# Patient Record
Sex: Female | Born: 1950 | Race: White | Marital: Married | Smoking: Never smoker
Health system: Southern US, Community
[De-identification: ages and names within clinical notes are randomized; demographics above are authoritative.]

## PROBLEM LIST (undated history)

## (undated) HISTORY — PX: KNEE SURGERY: SHX244

## (undated) HISTORY — PX: CHOLECYSTECTOMY: SHX55

## (undated) HISTORY — PX: HERNIA REPAIR: SHX51

---

## 2003-08-28 ENCOUNTER — Ambulatory Visit
Admit: 2003-08-28 | Disposition: A | Discharge status: Home / Self Care | Payer: Self-pay | Source: Ambulatory Visit | Admitting: Sports Medicine"

## 2003-09-13 ENCOUNTER — Ambulatory Visit
Admission: RE | Admit: 2003-09-13 | Disposition: A | Discharge status: Home / Self Care | Payer: Self-pay | Source: Ambulatory Visit | Admitting: Sports Medicine"

## 2004-09-26 ENCOUNTER — Ambulatory Visit
Admit: 2004-09-26 | Disposition: A | Discharge status: Home / Self Care | Payer: Self-pay | Source: Ambulatory Visit | Admitting: Obstetrics & Gynecology

## 2007-08-13 ENCOUNTER — Ambulatory Visit
Admit: 2007-08-13 | Disposition: A | Discharge status: Home / Self Care | Payer: Self-pay | Source: Ambulatory Visit | Admitting: Obstetrics & Gynecology

## 2011-04-01 ENCOUNTER — Ambulatory Visit
Admission: RE | Admit: 2011-04-01 | Discharge: 2011-04-01 | Payer: Self-pay | Source: Ambulatory Visit | Attending: Pediatrics | Admitting: Pediatrics

## 2011-04-01 ENCOUNTER — Ambulatory Visit: Payer: Self-pay

## 2011-04-02 LAB — LAB USE ONLY - HISTORICAL SURGICAL PATHOLOGY

## 2011-08-05 ENCOUNTER — Ambulatory Visit
Admission: RE | Admit: 2011-08-05 | Discharge: 2011-08-05 | Payer: Self-pay | Source: Ambulatory Visit | Attending: Pediatrics | Admitting: Pediatrics

## 2011-08-06 LAB — LAB USE ONLY - HISTORICAL SURGICAL PATHOLOGY

## 2011-10-15 NOTE — Op Note (Signed)
Chelsea Jensen, BOEHRINGER      MRN:          [id number]      Account:      [id number]      Document ID:  [id number]      Procedure Date: 04/01/2011            Admit Date: 04/01/2011            Patient Location: DISCHARGED 04/01/2011      Patient Type: A            SURGEON: Odette Horns MD            ASSISTANT:  Donnarose Klemovitch PA-C            PREOPERATIVE DIAGNOSES:  Chronic cholecystitis, cholelithiasis.            POSTOPERATIVE DIAGNOSES:  Chronic cholecystitis, cholelithiasis.            TITLE OF PROCEDURE:  Laparoscopic cholecystectomy.            ANESTHESIOLOGIST: Dr. _____.            CRNA: Kizzie Fantasia.            ANESTHESIA: General endotracheal, 0.5% Marcaine with epinephrine.            DESCRIPTION OF PROCEDURE:      The patient was placed supine on the operating room table.  After induction      of general endotracheal anesthesia and placement of an orogastric tube, the      patient's abdomen was prepped and draped in the usual sterile fashion.      Prior to each incision, the skin and subcutaneous tissues were infiltrated      with 0.5% Marcaine with epinephrine.            A 10-mm midline subumbilical incision was made and carried down through the      midline fascia with cautery.  Digital palpation of the underlying abdominal      wall was unremarkable.  A bladeless 12-mm trocar was placed into the      peritoneal cavity which was insufflated with CO2 up to a pressure of 15 mmHg.      The patient was placed in reverse Trendelenburg position and tilted to the      left.  Under direct vision three 5-mm trocars were placed in the right      subcostal space.  The gallbladder fundus was grasped and retracted laterally      and cephalad.  In reflecting the infundibulum laterally and caudad I noted that      the greater omental fat was adherent to the anterior surface of the      gallbladder.  The greater omental adhesions were dissected free bluntly.  With      cautery and blunt dissection in the  triangle of Calot,                                   Page 1 of 2      AMIL, MOSEMAN      MRN:          [id number]      Account:      [id number]      Document ID:  [id number]      Procedure Date: 04/01/2011            both  the cystic artery and the cystic duct were dissected free of fat.      Each structure was doubly clipped proximally, singly clipped distally, and      transected.  The gallbladder was completely dissected free off of the      gallbladder fossa with cautery.  The gallbladder was placed in an Endocatch      pouch and removed from the peritoneal cavity through the umbilical trocar      site.  Due to the large size of the stone, the umbilical trocar site fascia      was extended inferiorly with cautery.            In reevaluation of the perihepatic space I did not note any hemorrhage. The      clips were intact.  Under direct vision trocars were removed from the abdominal      wall verifying no hemorrhage.  Pneumoperitoneum was released. Fascia at the      umbilicus was reapproximated with 2 figure-of-eight 0 Vicryl sutures. Skin at      all 4 trocar sites was reapproximated with 4-0 Monocryl running subcuticular      suture.  Band-Aids were placed.  A total of 20 mL of 0.5% Marcaine with      epinephrine was used for the procedure.  The orogastric tube was removed. She      was extubated.  She was taken to the recovery room in stable condition.                        Electronic Signing Provider            D:  04/01/2011 16:24 PM by Dr. Jimmie Molly. Loleta Chance, MD ([id number])      T:  04/02/2011 10:26 AM by [id number]                  cc:                                   Page 2 of 2      Authenticated and Edited by Roque Lias, MD ([id number]) On 04/14/11 9:22:50 AM

## 2011-11-01 NOTE — Op Note (Signed)
Chelsea Jensen, Chelsea Jensen      MRN:          [id number]      Account:      [id number]      Document ID:  [id number]      Procedure Date: 08/05/2011            Admit Date: 08/05/2011            Patient Location: [facility]03      Patient Type: A            SURGEON: Odette Horns MD            ASSISTANT:  Lindalou Hose Tem[place]uk PA-C            PREOPERATIVE DIAGNOSES: Incisional hernia at the umbilicus, status post      laparoscopic cholecystectomy April 2012.            POSTOPERATIVE DIAGNOSES: Incisional hernia at the umbilicus, status post      laparoscopic cholecystectomy April 2012.            TITLE OF PROCEDURE: Incisional intraperitoneal mesh hernioplasty.            ANESTHESIOLOGIST: Dr. Lucile Shutters.            CRNA: Lyndle Herrlich, CRNA.            ANESTHESIA: General endotracheal, 0.5% Marcaine with epinephrine.            DESCRIPTION OF PROCEDURE:      The patient was placed supine on the operating room table.  After induction      of general endotracheal anesthesia, the patient's abdomen was prepped and      draped in the usual sterile fashion.  Prior to incision, the skin and      subcutaneous tissues in the subumbilical space were infiltrated with 0.5%      Marcaine with epinephrine.            A curvilinear incision was made between 3 and 9 o'clock in the subumbilical      space, and carrying this incision deeper into the subcutaneous tissues, I      noted a large hernia sac.  The hernia sac was dissected free from the      surrounding fatty tissues with a combination of cautery and sharp      dissection.  After defining the fascial edge circumferentially, the hernia      sac was completely transected off of the fascial edge and handed off the      table for pathological evaluation.  The base of the umbilicus was sharply      dissected off of the underlying fascia.  To digital palpation, in the      intraabdominal wall, I cleared all of the fat.  I did not note any adherent                                    Page 1 of 2      Chelsea Jensen, Chelsea Jensen      MRN:          [id number]      Account:      [id number]      Document ID:  [id number]      Procedure Date: 08/05/2011            small bowel loops or greater omentum.  I used a silk suture to measure the      diameter of the fascial defect which was almost 4 cm. The fascial defect was      round in shape, and so I elected to use a 10-cm round [employer] mesh for      hernia repair.  The mesh was placed into the peritoneal cavity. At the 12, 3,      6, and 9 o'clock positions, 1- 0 PDS suture was used transfascially to tack the      mesh to the overlying abdominal wall.  The polypropylene side of the mesh was      pulled up taut to the fascia, and the 4 PDS sutures were knotted at 12, 3, 6      and 9 o'clock.  I verified that the mesh laid flat.  After ensuring that there      were no bowel loops between the mesh and the abdominal wall, I used a SorbaFix      tacker, placing tacks approximately every 1 cm at the edge of the polypropylene      layer of the mesh.  To palpation, I noted that the mesh was tacked up to the      abdominal wall with no gaps.  At the 3 and 9 o'clock positions, I used a 3-0      Monocryl suture to tack the mesh to the overlying abdominal wall.  I noted that      the fascia could be easily reapproximated in a transverse manner over the mesh      and so this was accomplished with interrupted 3- 0 Monocryl sutures.  With each      suture, the polypropylene side of the mesh was also tacked to the fascia in      order to minimize empty space.  I should note that, before closing the fascia,      the space was copiously irrigated with normal saline and aspirated.  I verified      hemostasis.  The umbilicus was tacked to the abdominal fascia with a figure-of-      eight 3-0 Monocryl suture.  A more superficial layer of fat was reapproximated      transversely with interrupted 3-0 Monocryl suture.  Again, this space was      irrigated and aspirated.  I  verified hemostasis.  The skin edges were      reapproximated with 4-0 Monocryl running subcuticular suture.  A total of 20 mL      of 0.5% Marcaine with epinephrine was used for the procedure.  Bacitracin      ointment was placed as was a dressing.  She was placed in a 3 panel      elastasized abdominal binder, extubated, and taken to the recovery room in      stable condition.                        Electronic Signing Provider            D:  08/05/2011 09:55 AM by Dr. Jimmie Molly. Loleta Chance, MD ([id number])      T:  08/05/2011 10:37 AM by [id number]                  cc:     Lenetta Quaker MD  Page 2 of 2      Authenticated and Edited by Roque Lias, MD ([id number]) On 08/11/11 11:11:44 AM

## 2012-12-07 NOTE — Op Note (Unsigned)
SURGEON:             Cassell Smiles, MD      ADMITTED:            09/13/2003      PROCEDURE DATE: 09/13/2003      ROOM NUMBER:         [id number]            PREOPERATIVE DIAGNOSIS:  Left knee medial meniscus tear with loose body.            POSTOPERATIVE DIAGNOSIS:  Left knee medial meniscus tear, osteochondral      defect medial femoral condyle and trochlea with loose body.            PROCEDURE:  Arthroscopy, left knee, with medial meniscus repair, excision      loose body, chondroplasty medial femoral condyle and trochlea with [employer]      biopsy.            ANESTHESIA:  Local with intravenous sedation.            OPERATIVE INDICATIONS AND FINDINGS:  Chelsea Jensen is a 61 year old      otherwise healthy woman who has had locking of her left knee, unrelieved      with conservative treatment.  She had an MRI which showed a tear in the      medial meniscus and a loose body.  She was given risks, benefits, and      alternatives, and opted for surgical intervention.  Her operative findings      were a 1 x 1-cm loose body, tear in the posterior horn medial meniscus zone      A1, osteochondral defect measuring 20 x 20 mm on the medial femoral condyle      and trochlea.            OPERATIVE PROCEDURE IN DETAIL:  Once informed consent was obtained, the      patient was brought to the operating room.  A gram of Ancef was given IV      preoperatively for antibiotic prophylaxis.  All anatomic landmarks were      palpated.  The skin was sterilely prepped and draped in the usual fashion.      First, portals were anesthetized using 20 mL of 1% Lidocaine, and then 20      mL of 0.5% Marcaine with epinephrine.  Once the block had set up, 3 portals      were made, 1 superolateral, 1 inferolateral, and 1 inferomedial around the      patella for the introduction of the outflow, arthroscope, and instruments      respectively.  All compartments of the knee were visualized.  In the      suprapatellar pouch, there were no loose  bodies.  Medial and lateral facets      of the patella showed mild chondromalacia changes.  In the femoral      trochlea, there was a 20 x 20-mm osteochondral defect.  This was debrided      to a stable rim using a shaver.  This operative time was 15 minutes.  In      the medial compartment, there was a tear in the posterior horn of the      medial meniscus.  This was rasped and then repaired using a FasT-Fix suture      anchor.  There was also an osteochondral defect measuring 20 x 20 mm here.  This was debrided using a shaver.  This operative time was 15 minutes.  In      the intercondylar notch, there was a loose body present.  This was excised      using a grasper.  In the lateral compartment, lateral meniscus and lateral      articular surfaces were intact.  The instruments were then removed from the      knee.  The portals were closed using 4-0 Monocryl suture, and the knee was      instilled with 20 mL of 0.5% Marcaine with epinephrine at the end of the      case.  All sponge and needle counts were correct at the end of the case.      Estimated blood loss minimal.  IV fluids were 1000 mL crystalloid.  The      patient tolerated the procedure well and was brought to recovery room in      excellent condition.                              ___________________________________     Date Signed: _______________      Cassell Smiles, MD                  D 09/13/2003  1:11 P; T 09/14/2003 11:35 A; [id number] - - , [id number], #[id number]      CC:  Cassell Smiles, MD

## 2013-01-26 ENCOUNTER — Ambulatory Visit: Payer: BLUE CROSS/BLUE SHIELD | Admitting: Family

## 2013-01-26 ENCOUNTER — Encounter: Payer: Self-pay | Admitting: Family

## 2013-01-26 VITALS — BP 118/75 | HR 99 | Temp 98.0°F | Ht 65.5 in | Wt 184.8 lb

## 2013-01-26 MED ORDER — LOSARTAN POTASSIUM 50 MG PO TABS
50.0000 mg | ORAL_TABLET | Freq: Every day | ORAL | Status: DC
Start: 2013-01-26 — End: 2014-02-09

## 2013-01-26 NOTE — Progress Notes (Signed)
Subjective:       Patient ID: Chelsea Jensen is a 62 y.o. female.    HPIDoing well with current medications for BP and depression.  Sees psychiatry for follow up.      The following portions of the patient's history were reviewed and updated as appropriate: allergies, current medications, past family history, past medical history, past social history, past surgical history and problem list.    Review of Systems   Constitutional: Negative for activity change, appetite change and fatigue.        HAs had some weight loss.   Respiratory: Negative.    Cardiovascular: Negative.    Psychiatric/Behavioral: Negative.            Objective:    Physical Exam   Constitutional: She is oriented to person, place, and time. She appears well-developed and well-nourished.   Cardiovascular: Normal rate.    Pulmonary/Chest: Effort normal.   Neurological: She is alert and oriented to person, place, and time.   Psychiatric: She has a normal mood and affect. Her behavior is normal. Thought content normal.           Assessment:       Depression. Sees psychiatry  HTN. Stable.      Plan:       Refill meds  Discussed diet and exercise.  Follow up.

## 2014-02-09 ENCOUNTER — Encounter: Payer: Self-pay | Admitting: Internal Medicine

## 2014-02-09 ENCOUNTER — Ambulatory Visit: Payer: BLUE CROSS/BLUE SHIELD | Admitting: Internal Medicine

## 2014-02-09 VITALS — BP 125/62 | HR 77 | Temp 97.8°F | Resp 18 | Ht 65.0 in | Wt 184.4 lb

## 2014-02-09 DIAGNOSIS — Z1211 Encounter for screening for malignant neoplasm of colon: Secondary | ICD-10-CM

## 2014-02-09 DIAGNOSIS — Z2911 Encounter for prophylactic immunotherapy for respiratory syncytial virus (RSV): Secondary | ICD-10-CM

## 2014-02-09 DIAGNOSIS — I10 Essential (primary) hypertension: Secondary | ICD-10-CM

## 2014-02-09 DIAGNOSIS — F411 Generalized anxiety disorder: Secondary | ICD-10-CM

## 2014-02-09 DIAGNOSIS — Z299 Encounter for prophylactic measures, unspecified: Secondary | ICD-10-CM

## 2014-02-09 DIAGNOSIS — Z23 Encounter for immunization: Secondary | ICD-10-CM

## 2014-02-09 DIAGNOSIS — Z029 Encounter for administrative examinations, unspecified: Secondary | ICD-10-CM

## 2014-02-09 DIAGNOSIS — Z1231 Encounter for screening mammogram for malignant neoplasm of breast: Secondary | ICD-10-CM

## 2014-02-09 DIAGNOSIS — Z1159 Encounter for screening for other viral diseases: Secondary | ICD-10-CM

## 2014-02-09 DIAGNOSIS — F419 Anxiety disorder, unspecified: Secondary | ICD-10-CM

## 2014-02-09 MED ORDER — LOSARTAN POTASSIUM 50 MG PO TABS
50.0000 mg | ORAL_TABLET | Freq: Every day | ORAL | Status: DC
Start: 2014-02-09 — End: 2014-12-25

## 2014-02-09 NOTE — Progress Notes (Signed)
Subjective:       Patient ID: Chelsea Jensen is a 63 y.o. female.    Hypertension  This is a chronic problem. The current episode started more than 1 year ago. The problem has been gradually improving since onset. The problem is controlled. Pertinent negatives include no anxiety, chest pain, headaches, malaise/fatigue, neck pain, orthopnea, palpitations, peripheral edema, PND or shortness of breath. Risk factors for coronary artery disease include obesity. Past treatments include ACE inhibitors. The current treatment provides moderate improvement. There is no history of angina, kidney disease, CAD/MI, CVA, heart failure, left ventricular hypertrophy or renovascular disease. There is no history of chronic renal disease or hyperparathyroidism.       The following portions of the patient's history were reviewed and updated as appropriate: allergies, current medications, past family history, past medical history, past social history, past surgical history and problem list.    Review of Systems   Constitutional: Negative for malaise/fatigue.   Respiratory: Negative for shortness of breath.    Cardiovascular: Negative for chest pain, palpitations, orthopnea and PND.   Musculoskeletal: Negative for neck pain.   Neurological: Negative for headaches.           Objective:    Physical Exam  General appearance - alert, well appearing, and in no distress  Mental status - alert, oriented to person, place, and time  Chest - clear to auscultation, no wheezes, rales or rhonchi, symmetric air entry  Heart - normal rate, regular rhythm, normal S1, S2, no murmurs,   Back exam - full range of motion, no tenderness, palpable spasm or pain on motion  Neurological - alert, oriented, normal speech, no focal findings or movement disorder noted  Musculoskeletal - no joint tenderness, deformity or swelling  Extremities - peripheral pulses normal, no pedal edema, no clubbing or cyanosis  Skin - normal coloration and turgor, no rashes, no  suspicious skin lesions noted    Patient Active Problem List   Diagnosis   . HTN (hypertension)   . Anxiety     Current Outpatient Prescriptions   Medication Sig Dispense Refill   . buPROPion SR (WELLBUTRIN SR) 150 MG 12 hr tablet Take 150 mg by mouth 2 (two) times daily.       . DULoxetine (CYMBALTA) 60 MG capsule Take 60 mg by mouth daily.       Marland Kitchen losartan (COZAAR) 50 MG tablet Take 1 tablet (50 mg total) by mouth daily.  90 tablet  3   . [DISCONTINUED] losartan (COZAAR) 50 MG tablet Take 1 tablet (50 mg total) by mouth daily.  90 tablet  3     No Known Allergies          Assessment:        htn      Plan:        advice low salt cut simple carb add veg/fish/whole grain bp log target 130/85    Shingle vaccination given    Preventive referal given blood work order

## 2014-02-10 ENCOUNTER — Telehealth: Payer: Self-pay

## 2014-02-10 LAB — COMPREHENSIVE METABOLIC PANEL
ALT: 17 IU/L (ref 0–32)
AST (SGOT): 17 IU/L (ref 0–40)
Albumin/Globulin Ratio: 1.9 (ref 1.1–2.5)
Albumin: 4.4 g/dL (ref 3.6–4.8)
Alkaline Phosphatase: 50 IU/L (ref 39–117)
BUN / Creatinine Ratio: 18 (ref 11–26)
BUN: 14 mg/dL (ref 8–27)
Bilirubin, Total: 0.4 mg/dL (ref 0.0–1.2)
CO2: 27 mmol/L (ref 18–29)
Calcium: 9.6 mg/dL (ref 8.7–10.3)
Chloride: 101 mmol/L (ref 97–108)
Creatinine: 0.78 mg/dL (ref 0.57–1.00)
EGFR: 82 mL/min/{1.73_m2} (ref >59)
EGFR: 95 mL/min/{1.73_m2} (ref >59)
Globulin, Total: 2.3 g/dL (ref 1.5–4.5)
Glucose: 86 mg/dL (ref 65–99)
Potassium: 4.4 mmol/L (ref 3.5–5.2)
Protein, Total: 6.7 g/dL (ref 6.0–8.5)
Sodium: 142 mmol/L (ref 134–144)

## 2014-02-10 LAB — CARDIOVASCULAR RISK ASSESSMENT: Cardiovascular Risk PDF Image: 0

## 2014-02-10 LAB — HEMOGLOBIN A1C: Hemoglobin A1C: 5.7 % — ABNORMAL HIGH (ref 4.8–5.6)

## 2014-02-10 LAB — LIPID PANEL
Cholesterol / HDL Ratio: 3.4 ratio units (ref 0.0–4.4)
Cholesterol: 227 mg/dL — ABNORMAL HIGH (ref 100–199)
HDL: 67 mg/dL (ref >39)
LDL Calculated: 141 mg/dL — ABNORMAL HIGH (ref 0–99)
Triglycerides: 97 mg/dL (ref 0–149)
VLDL Calculated: 19 mg/dL (ref 5–40)

## 2014-02-10 LAB — TSH: TSH: 3.39 u[IU]/mL (ref 0.450–4.500)

## 2014-02-10 LAB — HEPATITIS C ANTIBODY: HCV AB: <0.1 s/co ratio (ref 0.0–0.9)

## 2014-02-10 NOTE — Telephone Encounter (Signed)
Left message on pt.'s vm to call office.

## 2014-02-10 NOTE — Telephone Encounter (Signed)
Message copied by Margarette Canada on Fri Feb 10, 2014  9:32 AM  ------       Message from: [facility], ALI       Created: Fri Feb 10, 2014  7:37 AM         ldl elevated also mild a1c elevated  Cut simple carb likepast/bread   Add veg /fish/fruit  Cook in olive oil  Counsel 150 min/wk of  Exercise  Follow up 6 month

## 2014-12-25 ENCOUNTER — Telehealth (INDEPENDENT_AMBULATORY_CARE_PROVIDER_SITE_OTHER): Payer: Self-pay | Admitting: Internal Medicine

## 2014-12-25 DIAGNOSIS — I10 Essential (primary) hypertension: Secondary | ICD-10-CM

## 2014-12-25 MED ORDER — LOSARTAN POTASSIUM 50 MG PO TABS
50.0000 mg | ORAL_TABLET | Freq: Every day | ORAL | Status: AC
Start: 2014-12-25 — End: ?

## 2014-12-25 NOTE — Telephone Encounter (Signed)
Medication refill done

## 2021-02-27 IMAGING — MG MAMMO BREAST SCREENING TOMOSYNTHESIS BILATERAL
9 of 14 series · 9 of 30 positions shown · non-contrast
Comparison: none

This is a summary report. The complete report is available in the patient's medical record. If you cannot access the medical record, please contact the sending organization for a detailed fax or copy.
EXAM:
Mammogram breast screening tomosynthesis bilateral
REASON FOR EXAM: breast cancer screening
HISTORY: Patient is 70 y.o. Family medical history includes hypertension in mother. Hormone history includes other. Surgical history includes hernia repair and tonsillectomy. Medical history includes hypertension and asthma.
LMP: No LMP recorded (lmp unknown). Patient is postmenopausal.
BREAST COMPOSITION:
The breasts have scattered areas of fibroglandular density.

[L XCCL]
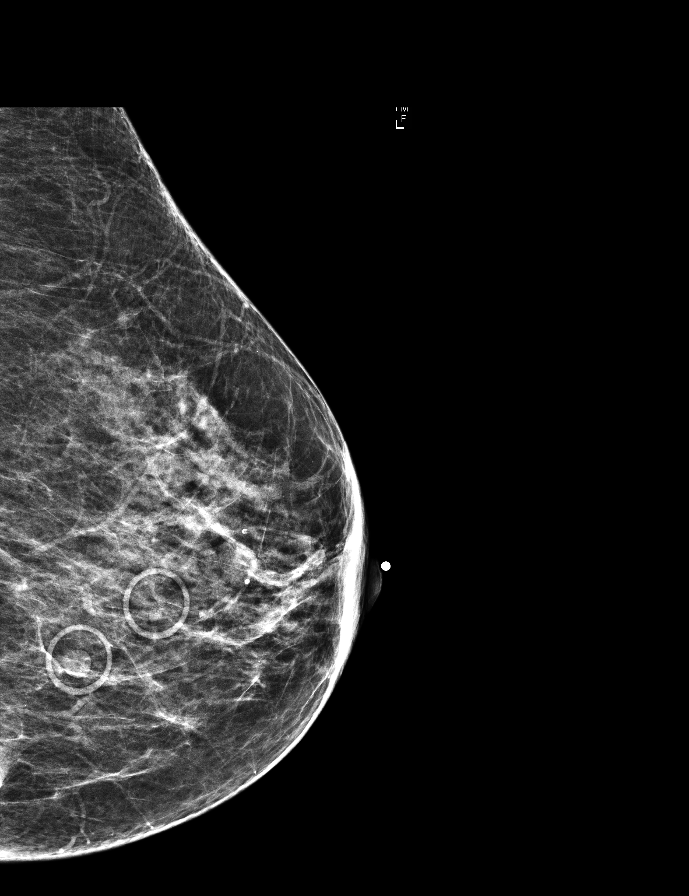

[R XCCL]
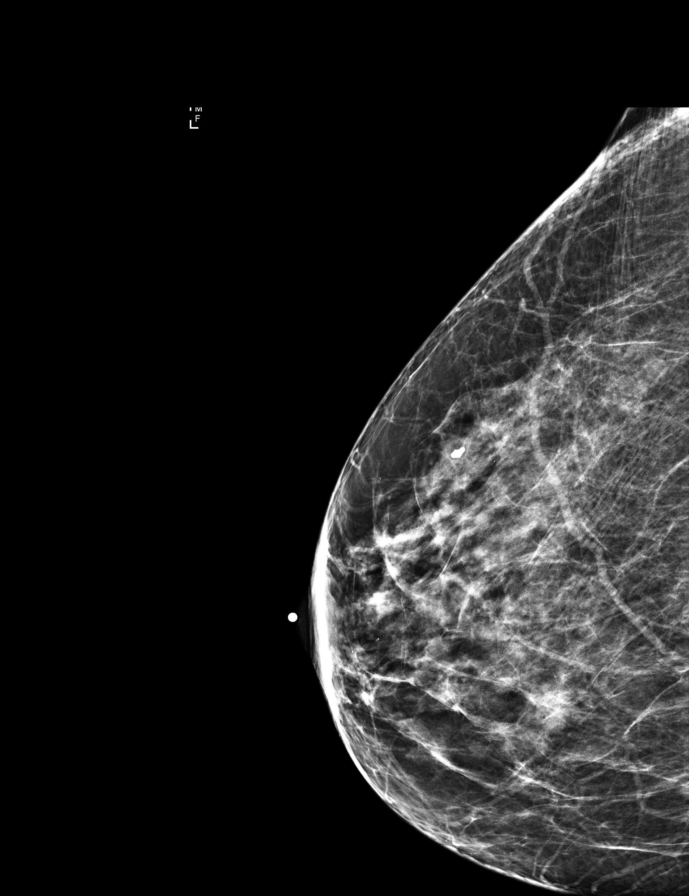

[L CC synth-2D]
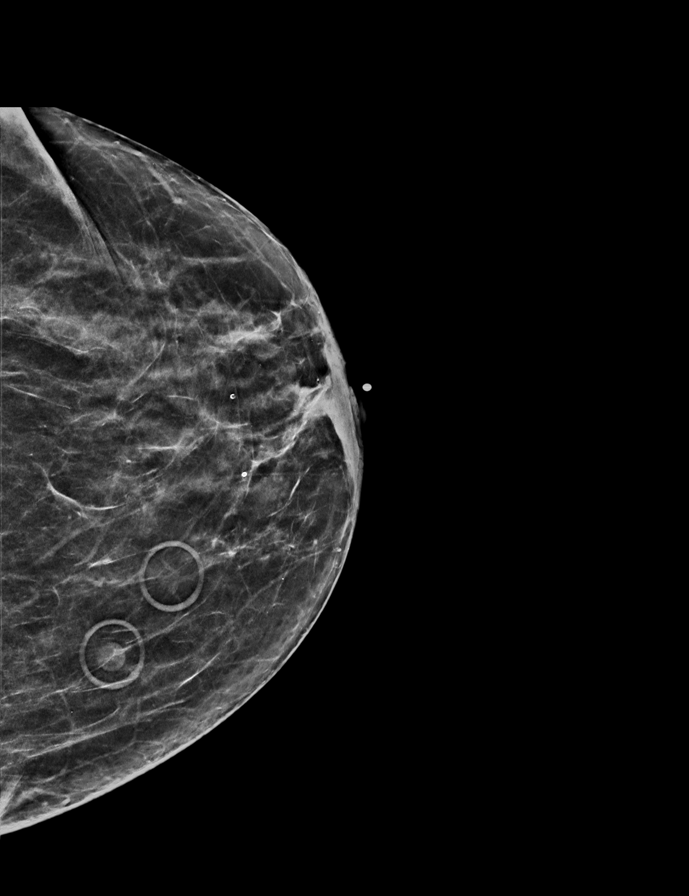

[R MLO]
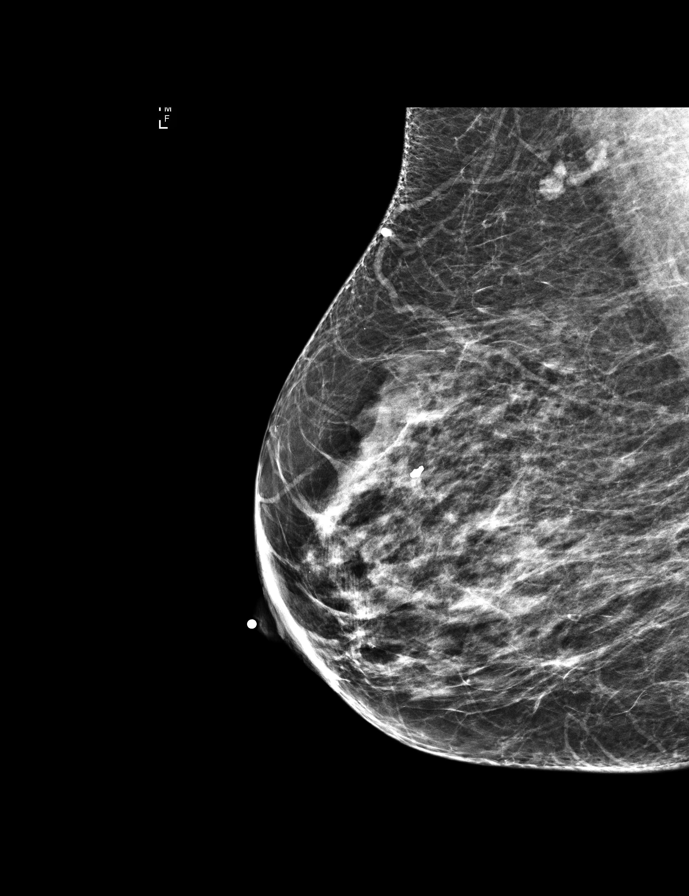

[R CC]
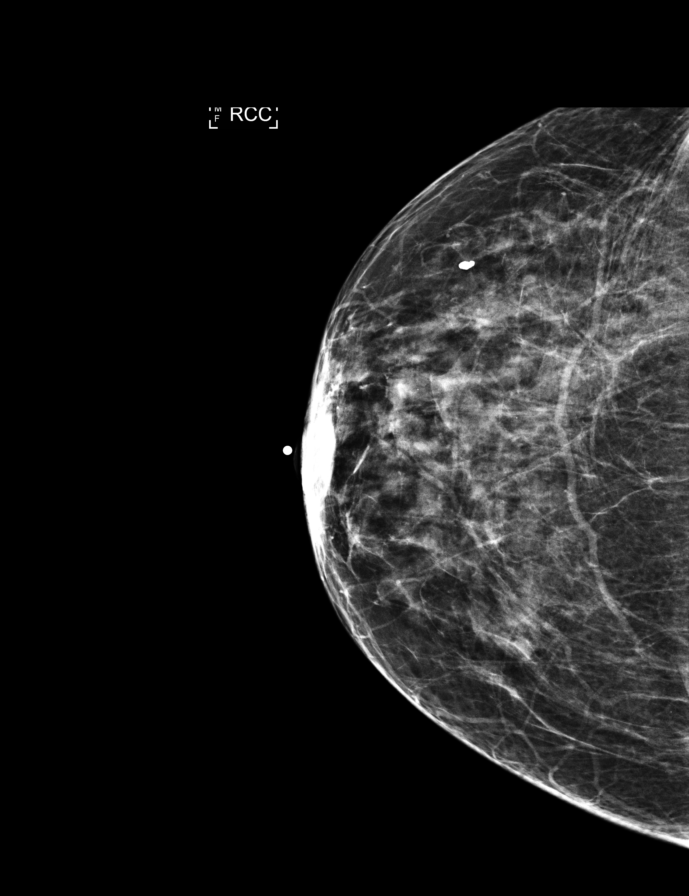

[L CC]
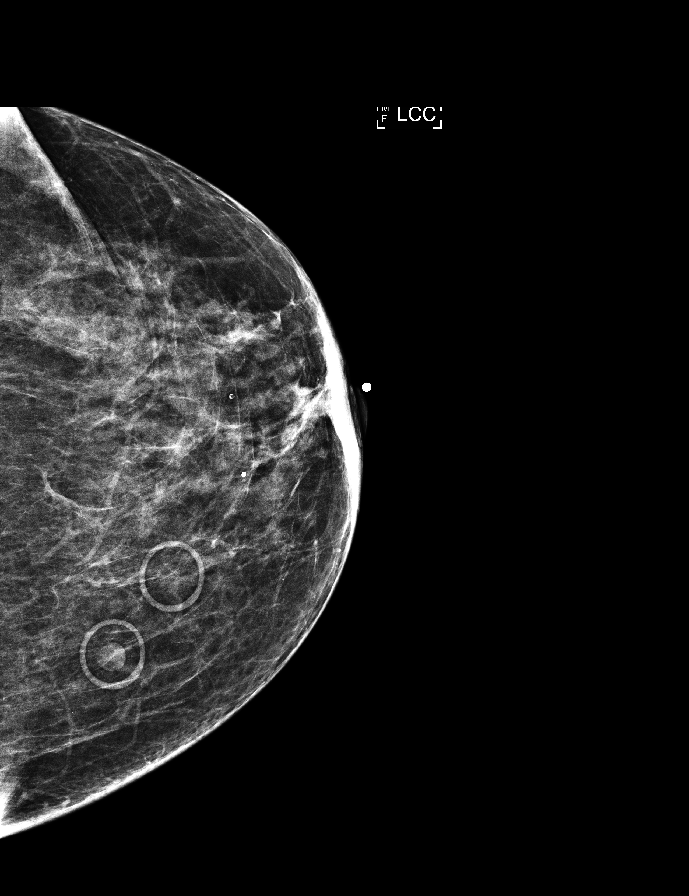

[R CC synth-2D]
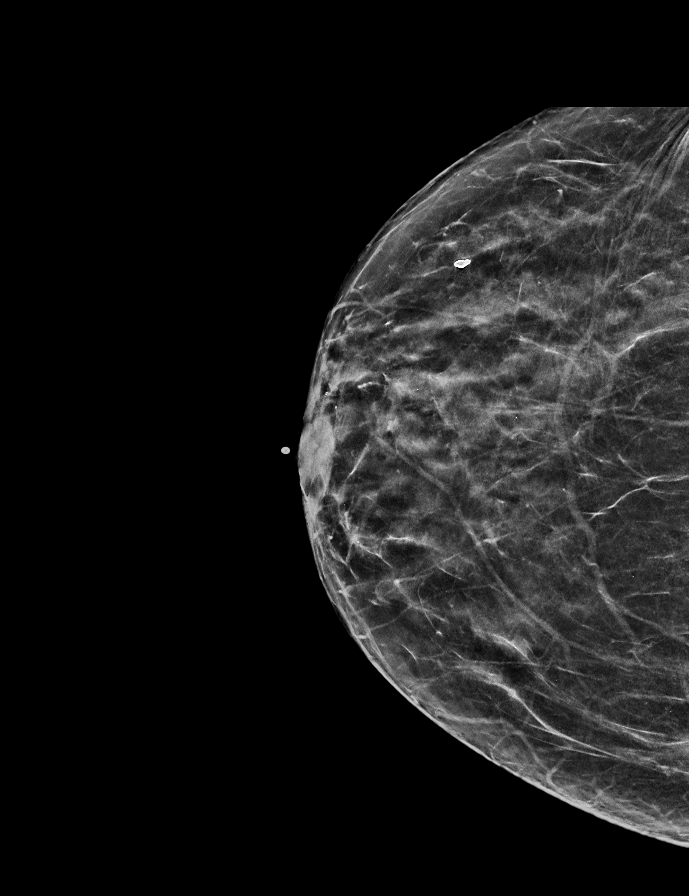

[L MLO]
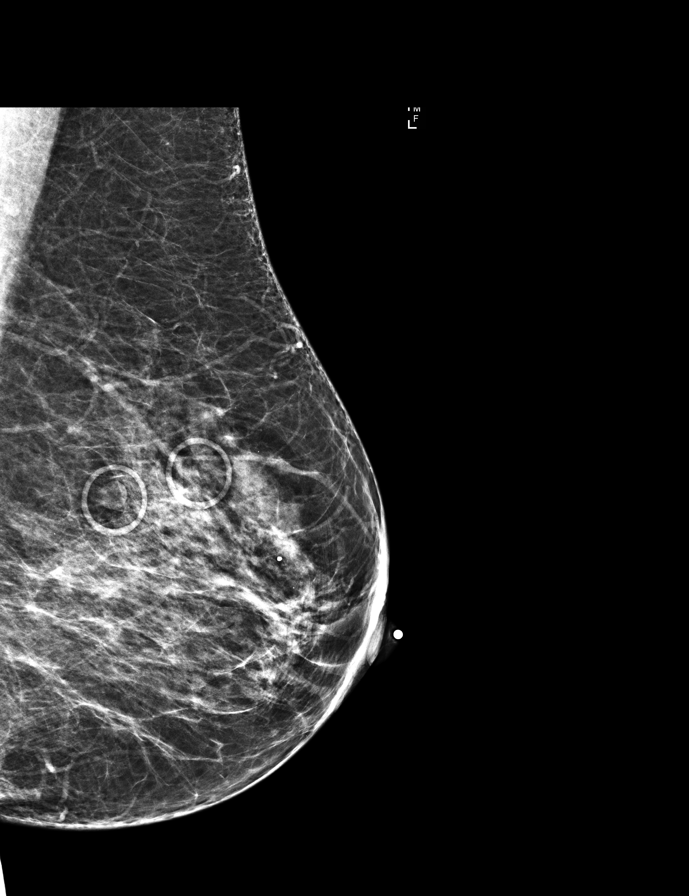

[R MLO synth-2D]
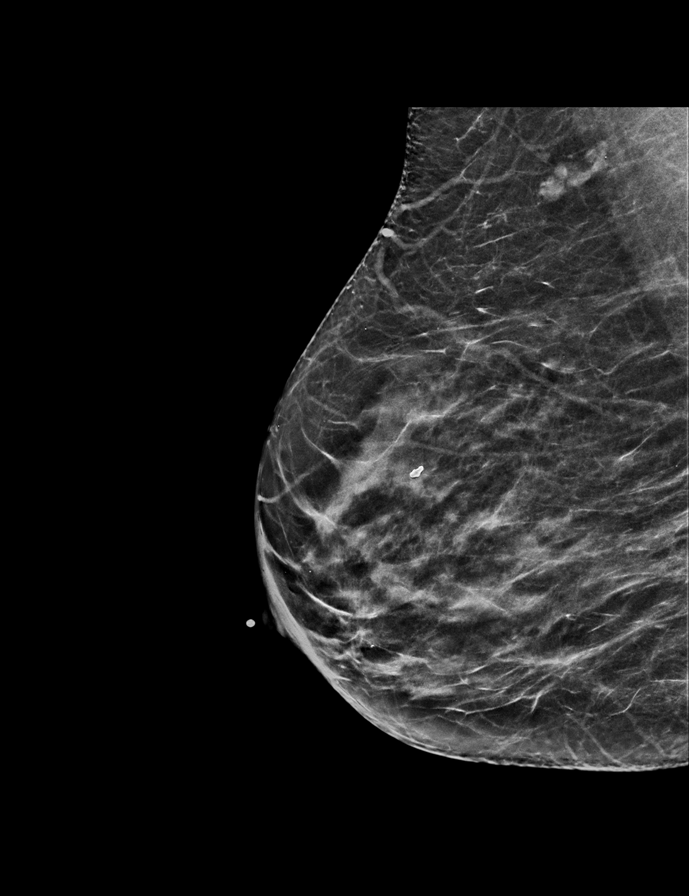

[9 of 30 positions shown; findings below may reference images not displayed]

FINDINGS: Computer-aided detection was utilized by the radiologist in the interpretation of this examination. This procedure was performed using Tomosynthesis.
There is no evidence of suspicious masses, calcifications, or other abnormal findings.
FILMS COMPARED:
04/15/2016 SCREEN MAMMO DIR DIGITAL BIALT
07/16/2017 SCREEN MAMMO DIR DIGITAL BIALT
07/20/2018 SCREEN MAMMO DIR DIGITAL BIALT
09/08/2019 Mammogram breast screening bilateral
IMPRESSION: No mammographic evidence of malignancy.
BI-RADS CATEGORY:
Overall: 2 - Benign
RECOMMENDATION:
- Routine Screening Mammogram in 1 Yr.
LOCATION CODE: 1

## 2022-06-10 IMAGING — MG MAMMO BREAST SCREENING TOMOSYNTHESIS BILATERAL
9 of 17 series · 9 of 33 positions shown · non-contrast
Comparison: Previous mammogram films from 02/27/2021 and 09/08/2019.

This is a summary report. The complete report is available in the patient's medical record. If you cannot access the medical record, please contact the sending organization for a detailed fax or copy.
FINAL REPORT:
Bilateral digital screening mammogram with tomosynthesis
HISTORY: Routine mammogram exam.

[R MLO (1 of 3)]
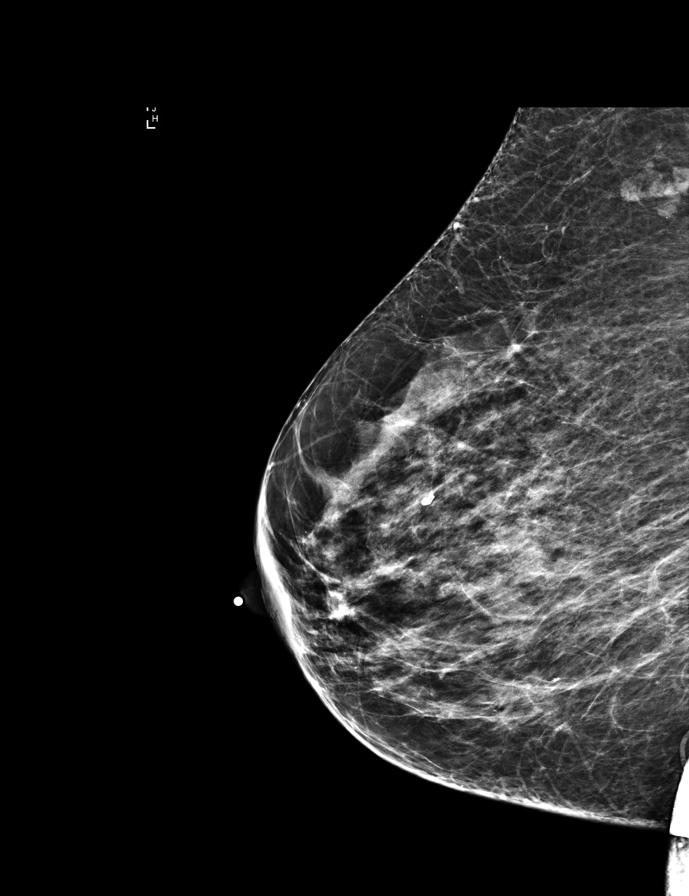

[L MLO (1 of 2)]
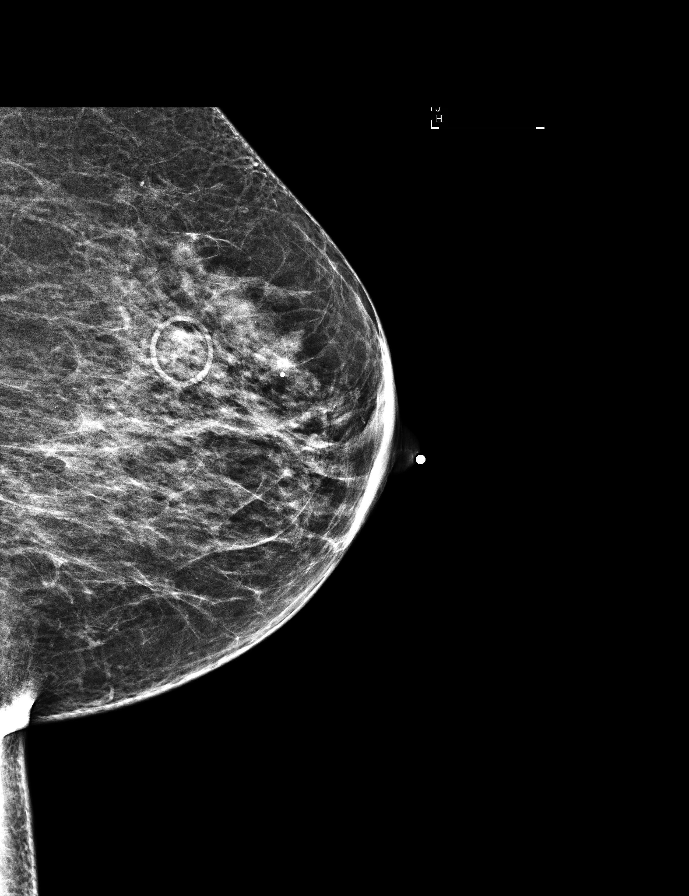

[R MLO (2 of 3)]
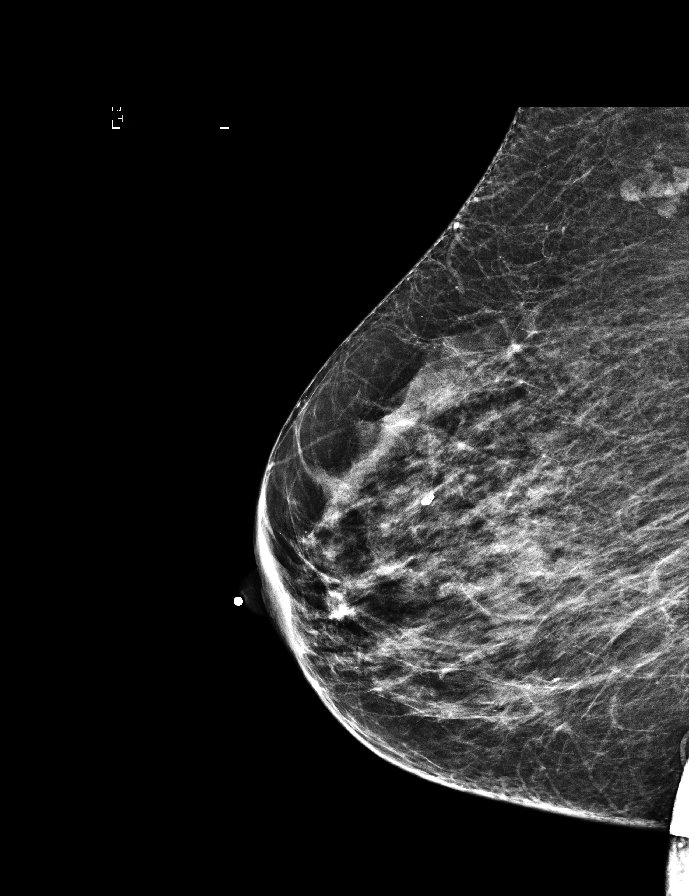

[R XCCL]
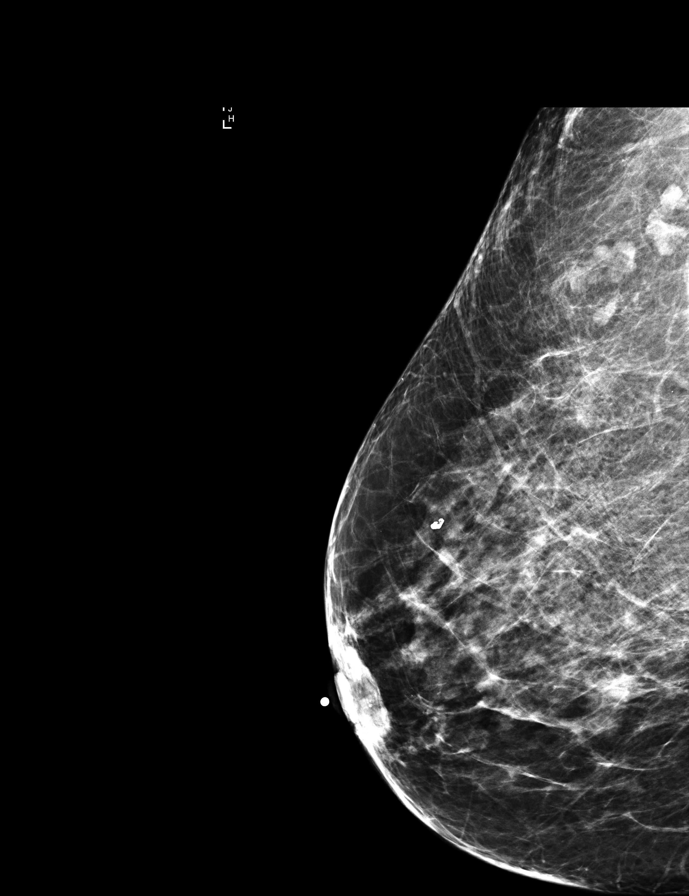

[L XCCL]
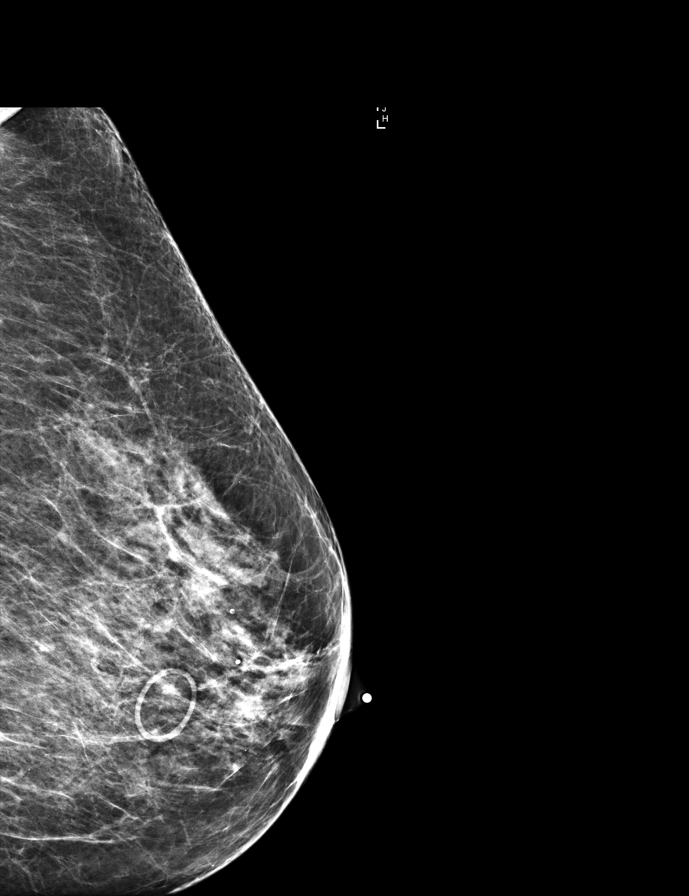

[L MLO (2 of 2)]
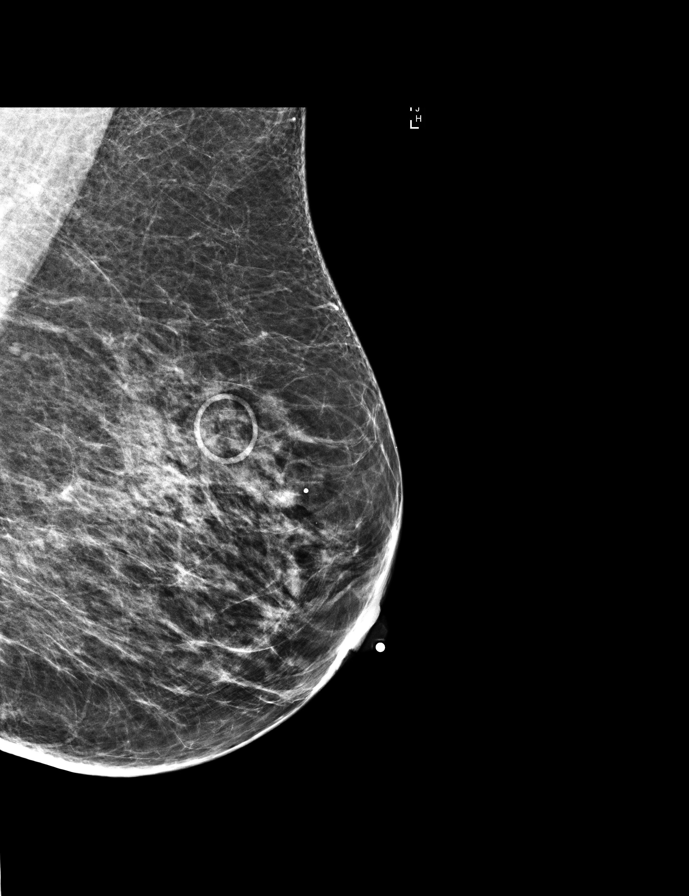

[L MLO synth-2D]
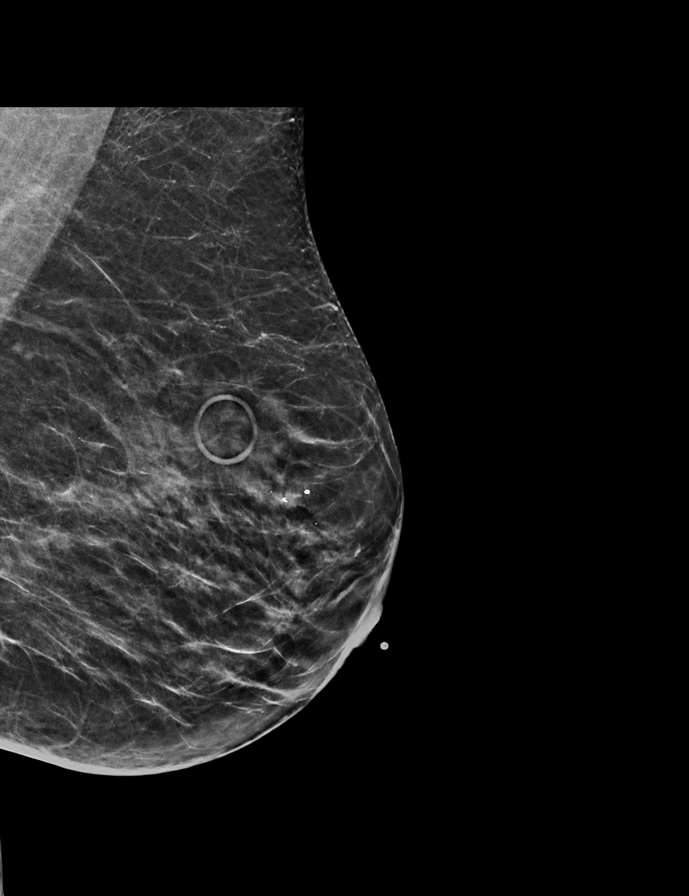

[R MLO synth-2D]
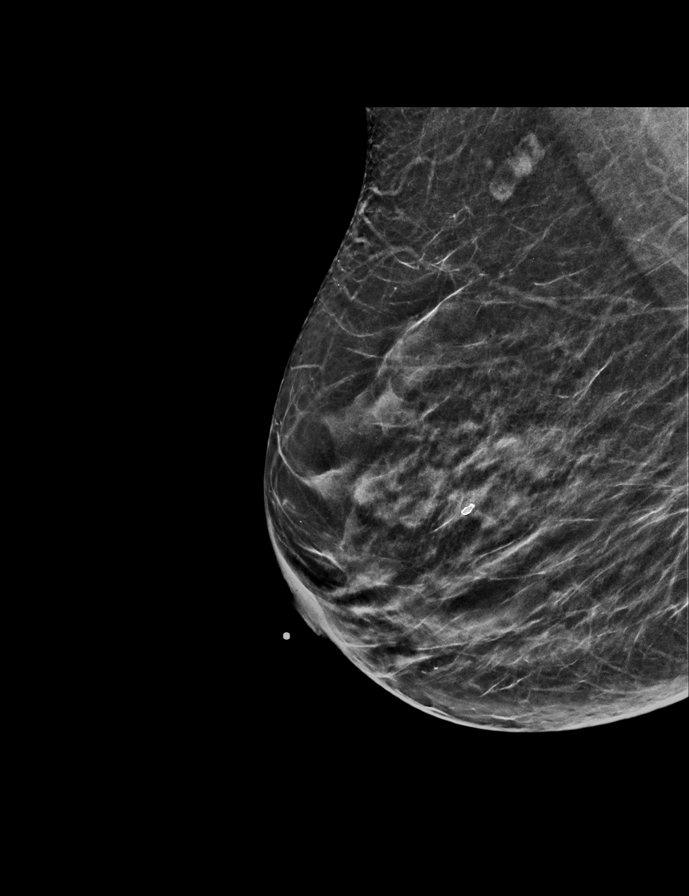

[R MLO (3 of 3)]
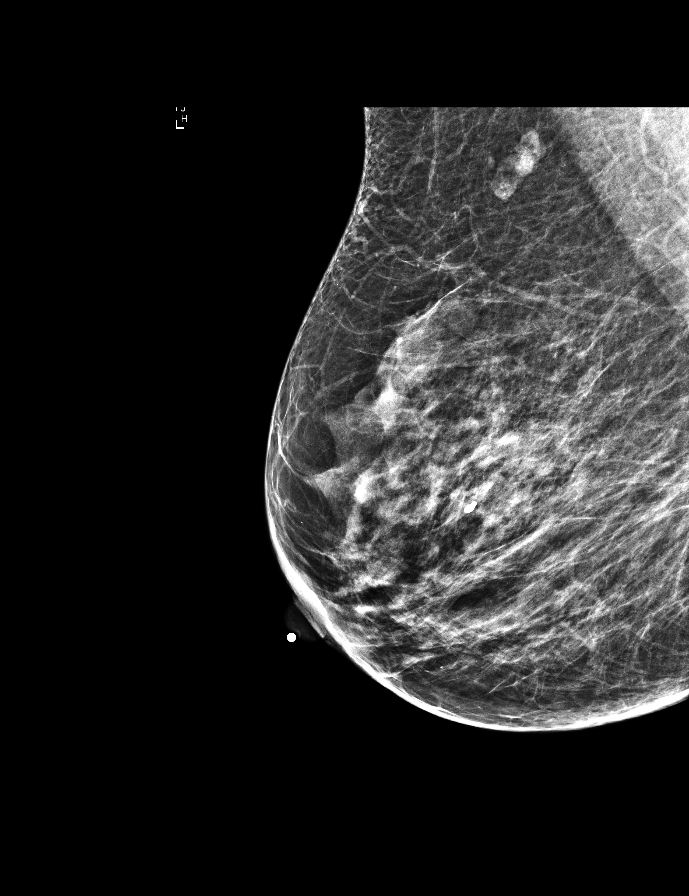

[9 of 33 positions shown; findings below may reference images not displayed]

FINDINGS: CAD was utilized for this examination. 3-D tomography performed in addition to routine imaging. The breasts are heterogeneously dense in composition, which limits the sensitivity of mammography.  There are benign calcifications.  No suspicious masses, architectural distortion, or cluster of calcifications.
IMPRESSION: 1. BI-RADS 2, benign findings.
2. Recommend routine annual screening mammogram.
ACR breast density: C, heterogeneously dense which may obscure a small mass
The patient will be entered into a reminder system with a target due date for the next mammogram.

## 2024-02-26 IMAGING — MR MRI BRAIN WITH AND WITHOUT CONTRAST
18 series · 48 of 48 positions shown · IV contrast (gadolinium)
Comparison: none

FINAL REPORT:
MRI of the brain with and without contrast
HISTORY: Parkinson's disease with dyskinesia, without mention of fluctuations
TECHNIQUE: Multiplanar and multisequence images were acquired through the brain before and after the administration of IV gadolinium contrast. Informed consent was obtained prior to the administration of IV contrast.

[Series 1: survey · sagittal · 1.6mm · 1.62mm/px · 5 of 127 slices shown]
[im 1/127]
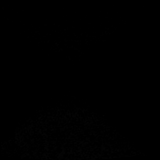
[im 32/127]
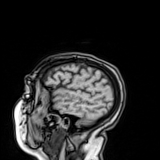
[im 64/127]
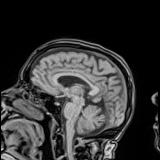
[im 95/127]
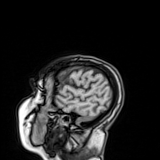
[im 127/127]
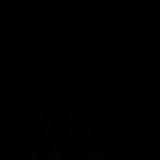

[Series 2: survey_mpr_sag · sagittal · 1.6mm · 1.60mm/px · 1 of 5 slices shown]
[im 1/5]
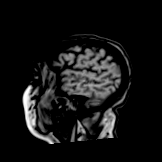

[Series 3: survey_mpr_cor · coronal · 1.6mm · 1.60mm/px · 1 of 3 slices shown]
[im 1/3]
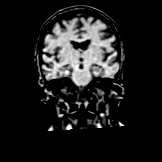

[Series 4: survey_mpr_tra · axial · 1.6mm · 1.60mm/px · 1 of 3 slices shown]
[im 1/3]
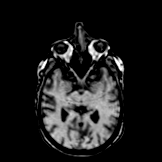

[Series 5: T1 · sagittal · 4.0mm · 0.72mm/px · 1 of 27 slices shown (1 of 4)]
[im 1/27]
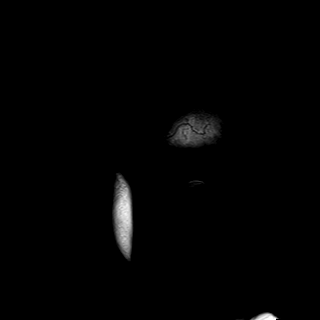

[Series 6: ep2d_diff_(id)_trace_p2_tracew · axial · 4.0mm · 0.62mm/px · 1 of 33 slices shown]
[im 1/33]
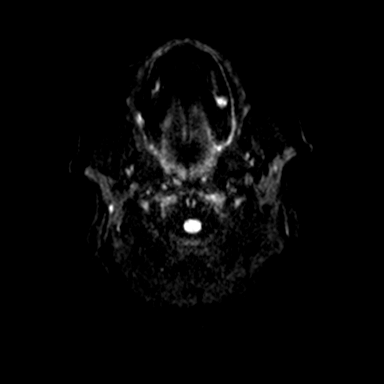

[Series 7: ep2d_diff_(id)_trace_p2_adc · axial · 4.0mm · 0.62mm/px · 1 of 33 slices shown]
[im 1/33]
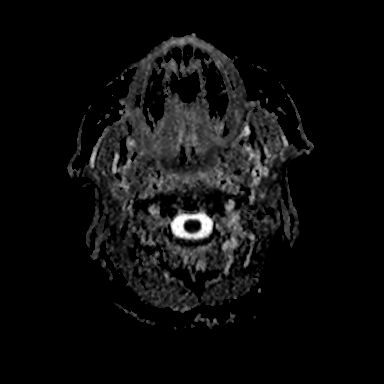

[Series 8: ep2d_diff_(id)_trace_p2_exp · axial · 4.0mm · 0.62mm/px · 1 of 33 slices shown]
[im 1/33]
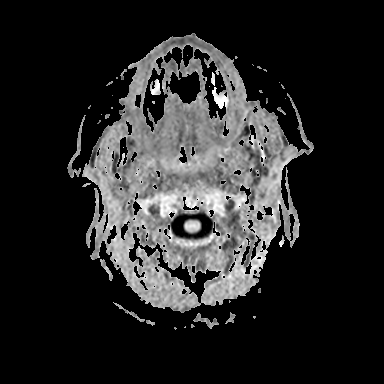

[Series 9: FLAIR · axial · 4.0mm · 0.72mm/px · 1 of 33 slices shown]
[im 1/33]
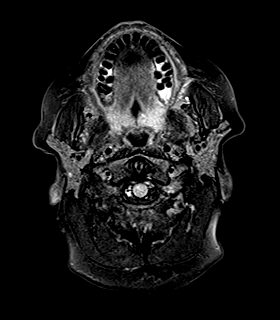

[Series 10: T1 · axial · 4.0mm · 0.72mm/px · 1 of 33 slices shown (2 of 4)]
[im 1/33]
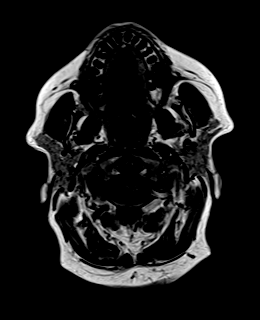

[Series 11: GRE · axial · 4.0mm · 0.45mm/px · 1 of 33 slices shown (1 of 2)]
[im 1/33]
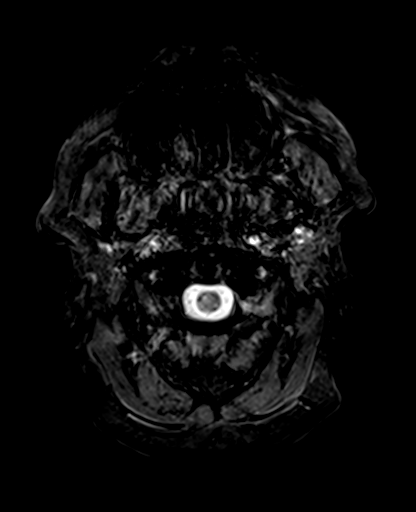

[Series 12: T2 fat-sat · axial · 4.0mm · 0.57mm/px · z∈[-55,+92]mm · 2 of 33 slices shown (1 of 2)]
[im 1/33]
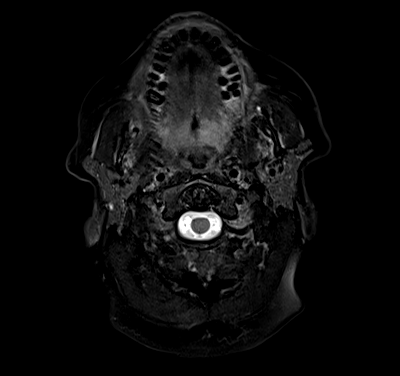
[im 33/33]
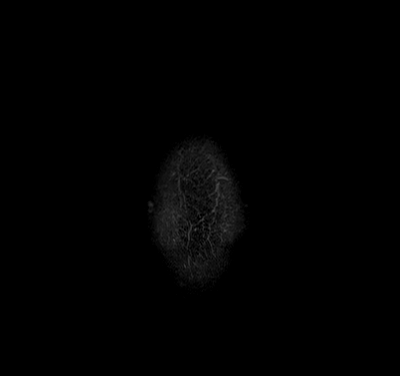

[Series 13: T2 fat-sat · axial · 4.0mm · 0.72mm/px · z∈[-53,+94]mm · 2 of 33 slices shown (2 of 2)]
[im 1/33]
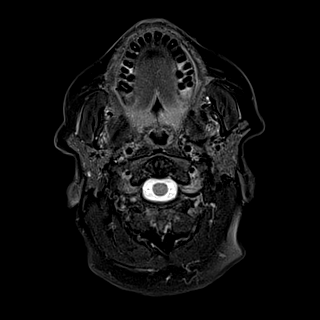
[im 33/33]
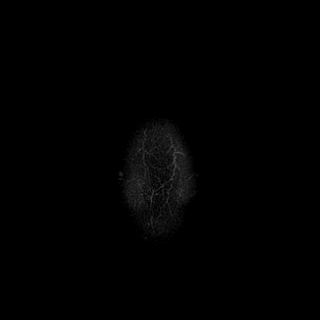

[Series 14: GRE · axial · 4.0mm · 1.20mm/px · z∈[-54,+93]mm · 2 of 33 slices shown (2 of 2)]
[im 1/33]
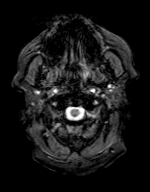
[im 33/33]
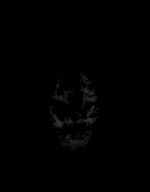

[Series 15: T1 post-contrast · axial · 4.0mm · 0.72mm/px · z∈[-54,+94]mm · 2 of 33 slices shown (1 of 2)]
[im 1/33]
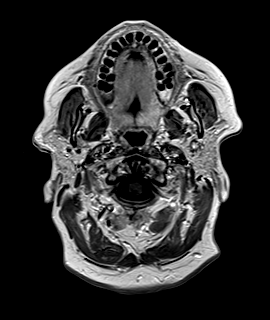
[im 33/33]
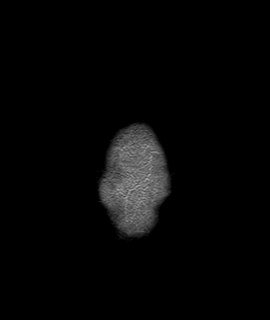

[Series 16: T1 post-contrast · axial · 1.0mm · 0.97mm/px · z∈[-52,+101]mm · 7 of 160 slices shown (2 of 2)]
[im 1/160]
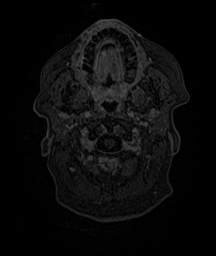
[im 27/160]
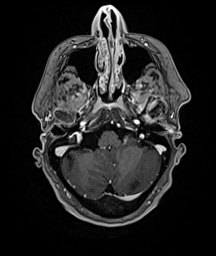
[im 54/160]
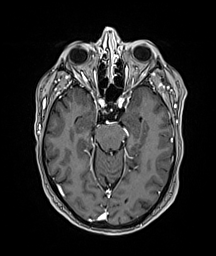
[im 80/160]
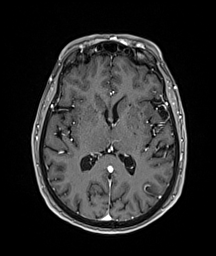
[im 107/160]
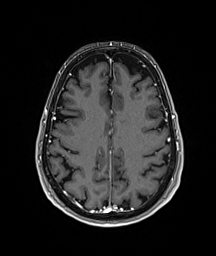
[im 133/160]
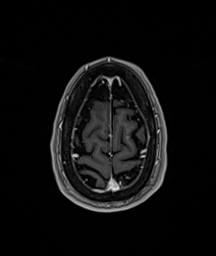
[im 160/160]
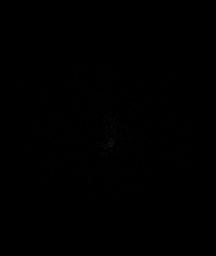

[Series 17: T1 · sagittal · 1.0mm · 0.97mm/px · 7 of 158 slices shown (3 of 4)]
[im 1/158]
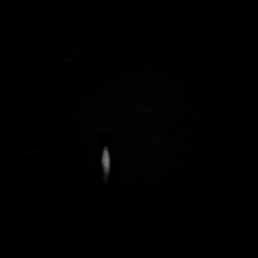
[im 27/158]
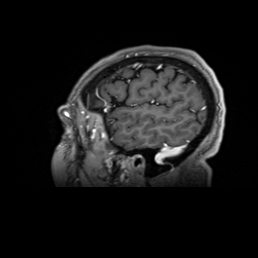
[im 53/158]
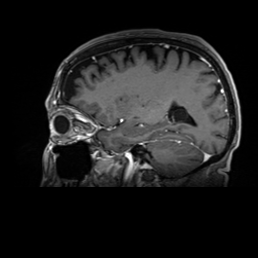
[im 79/158]
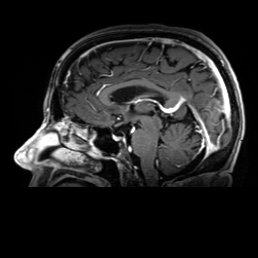
[im 105/158]
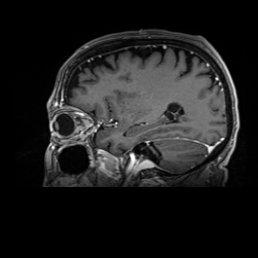
[im 131/158]
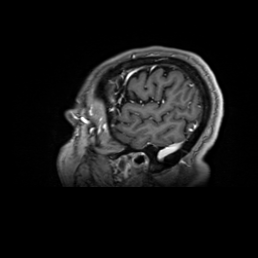
[im 158/158]
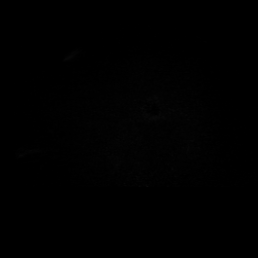

[Series 18: T1 · coronal · 1.0mm · 0.97mm/px · 11 of 239 slices shown (4 of 4)]
[im 1/239]
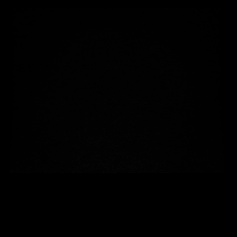
[im 24/239]
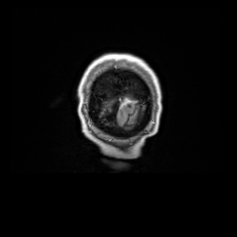
[im 48/239]
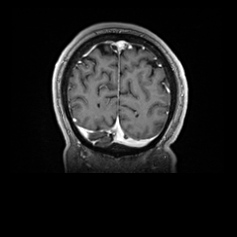
[im 72/239]
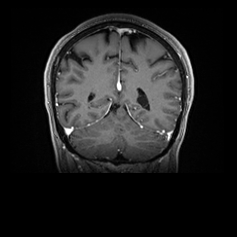
[im 96/239]
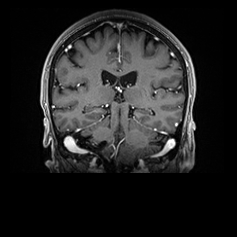
[im 120/239]
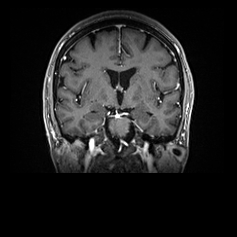
[im 143/239]
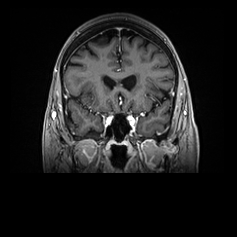
[im 167/239]
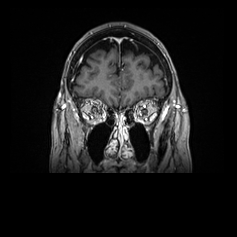
[im 191/239]
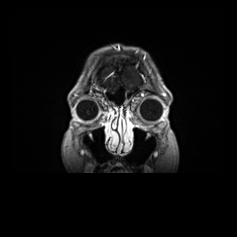
[im 215/239]
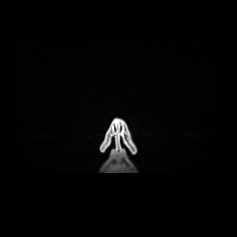
[im 239/239]
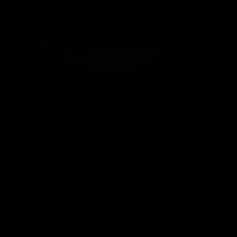

[48 of 48 positions shown; findings below may reference images not displayed]

FINDINGS: No abnormal areas of restricted diffusion. No abnormal areas of susceptibility artifact on gradient echo images. There is mild diffuse cerebral and cerebellar volume loss. There are mild to moderate chronic microangiopathic changes.
The ventricles are within normal size limits. The posterior fossa contents are unremarkable. Sella is unremarkable. The imaged paranasal sinuses and mastoid air cells are predominantly clear. There are grossly normal flow voids in the major intracranial vessels. Postcontrast enhanced images demonstrates no abnormal parenchymal, dural, or leptomeningeal enhancement.
IMPRESSION: 
IMPRESSION: Mild diffuse volume loss with mild to moderate chronic microangiopathic changes. No acute intracranial process.

## 2024-02-26 IMAGING — MR MRI LUMBAR SPINE WITHOUT CONTRAST
8 of 11 series · 27 of 48 positions shown · non-contrast
Comparison: none

FINAL REPORT:
MRI of the lumbar spine without contrast
HISTORY: Parkinson's disease with dyskinesia, without mention of fluctuations
TECHNIQUE: Multiplanar and multisequence images were acquired through the lumbar spine without contrast.

[Series 5: T2 · sagittal · 4.0mm · 0.62mm/px · 3 of 17 slices shown (1 of 4)]
[im 1/17]
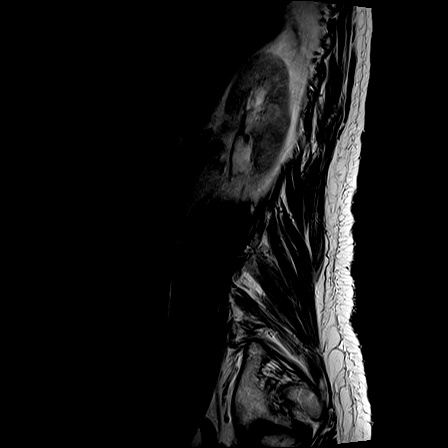
[im 9/17]
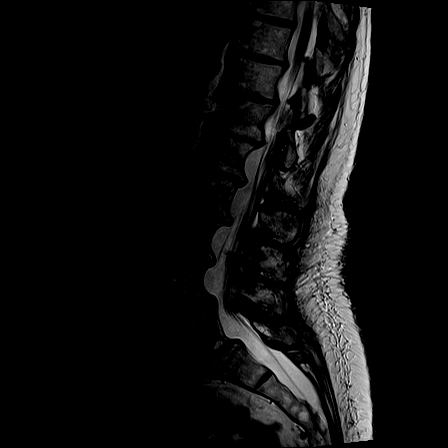
[im 17/17]
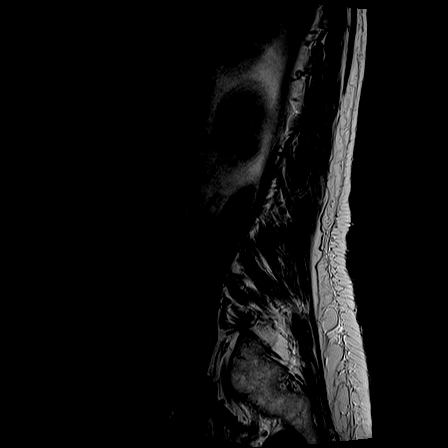

[Series 6: STIR · sagittal · 4.0mm · 0.88mm/px · 3 of 17 slices shown]
[im 1/17]
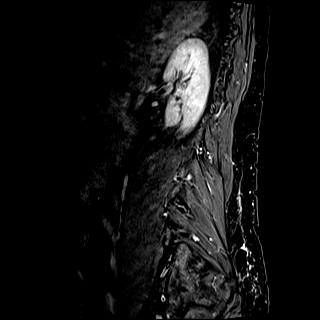
[im 9/17]
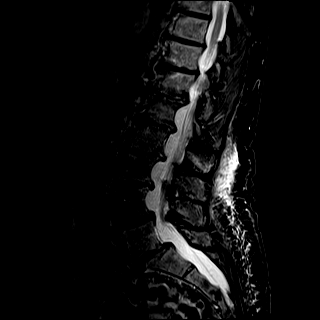
[im 17/17]
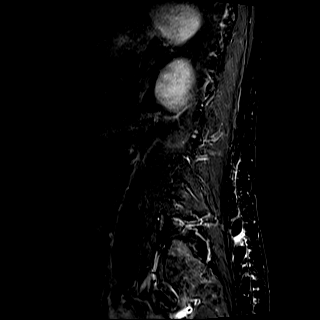

[Series 7: T1 · sagittal · 4.0mm · 0.73mm/px · 3 of 17 slices shown (1 of 3)]
[im 1/17]
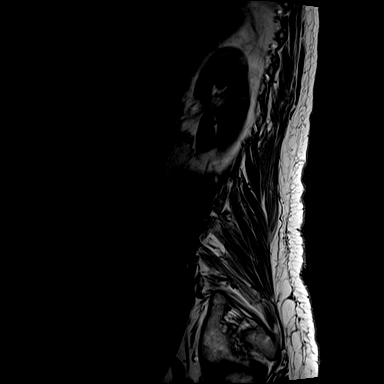
[im 9/17]
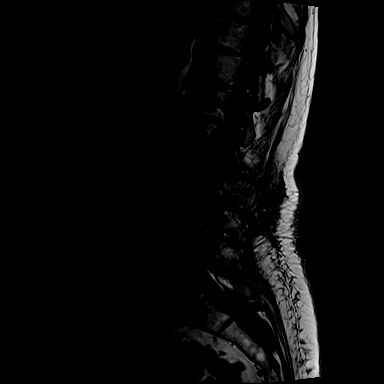
[im 17/17]
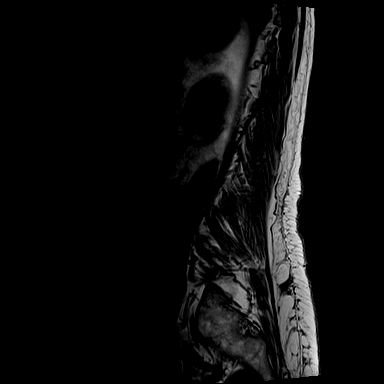

[Series 8: T2 · coronal · 4.0mm · 0.70mm/px · 4 of 25 slices shown (2 of 4)]
[im 1/25]
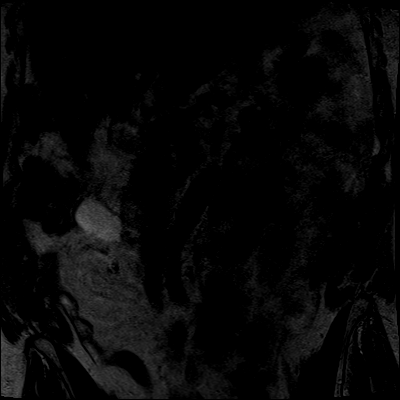
[im 9/25]
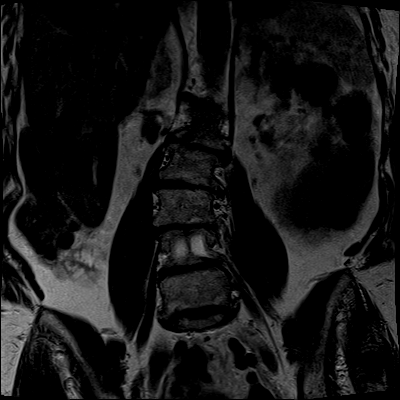
[im 17/25]
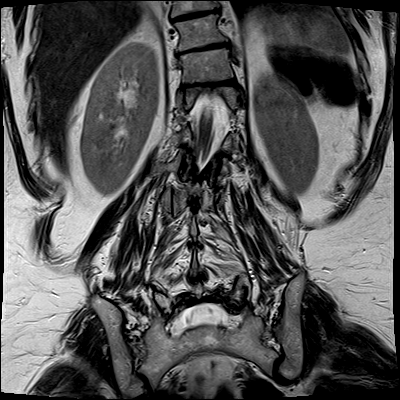
[im 25/25]
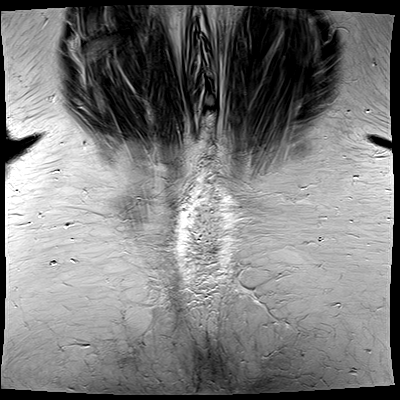

[Series 9: T2 · axial · 4.0mm · 0.69mm/px · z∈[-50,+6]mm · 2 of 15 slices shown (3 of 4)]
[im 1/15]
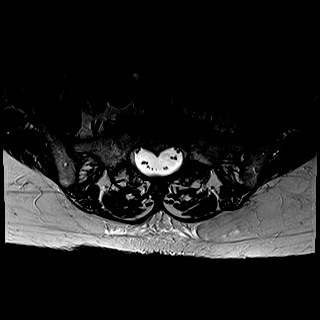
[im 15/15]
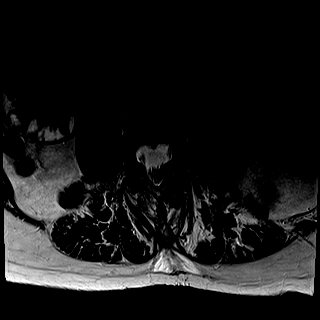

[Series 10: T2 · axial · 4.0mm · 0.69mm/px · z∈[+67,+202]mm · 5 of 32 slices shown (4 of 4)]
[im 1/32]
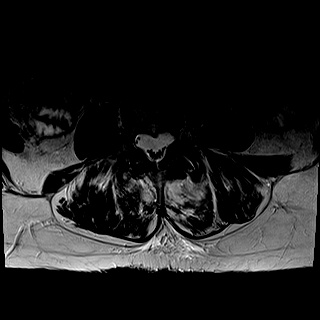
[im 8/32]
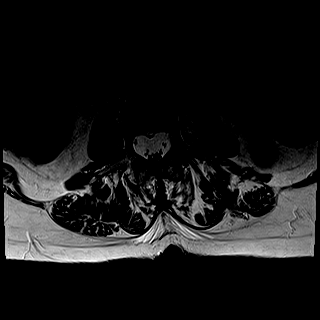
[im 16/32]
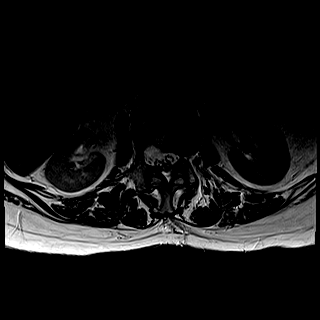
[im 24/32]
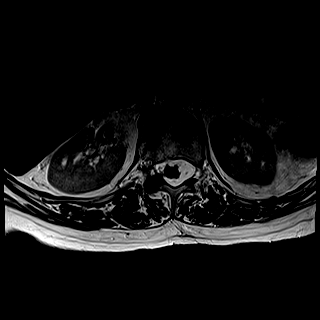
[im 32/32]
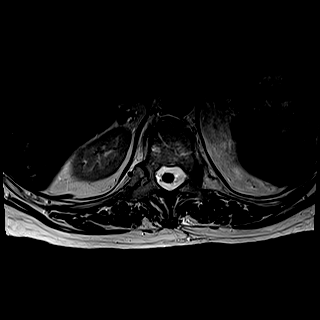

[Series 12: T1 · axial · 4.0mm · 0.69mm/px · z∈[-50,+6]mm · 2 of 15 slices shown (2 of 3)]
[im 1/15]
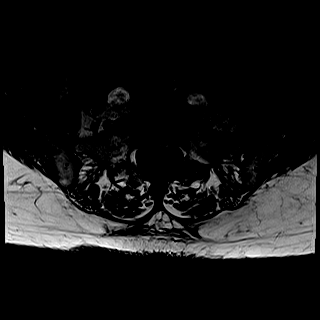
[im 15/15]
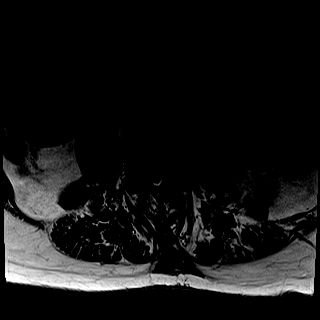

[Series 13: T1 · axial · 4.0mm · 0.69mm/px · z∈[+67,+202]mm · 5 of 32 slices shown (3 of 3)]
[im 1/32]
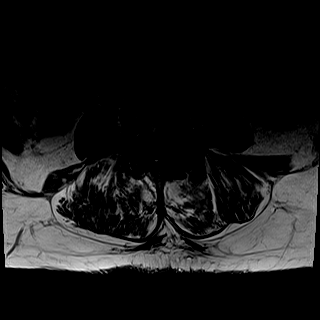
[im 8/32]
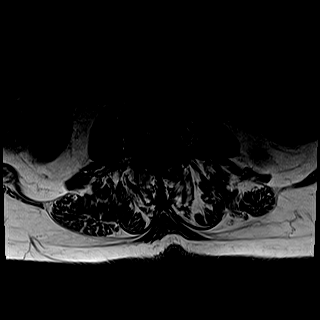
[im 16/32]
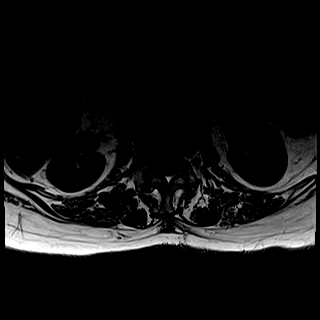
[im 24/32]
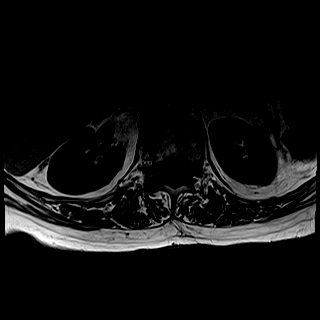
[im 32/32]
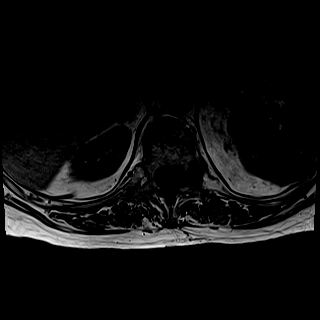

[27 of 48 positions shown; findings below may reference images not displayed]

FINDINGS: For the purposes of this exam there are assumed to be 5 nonrib-bearing lumbar vertebra with the most inferior fully articulating disc designated as L5-S1. The conus medullaris terminates at L1-2. There is 6 mm of grade 1 anterolisthesis of L5 on S1. A unilateral left L5 pars defect cannot be excluded. Remainder of the lumbar spine in gross anatomic alignment. No marrow edema in the lumbar spine. No significant loss of the lumbar vertebral body heights. Imaged paravertebral soft tissue structures are grossly unremarkable.
At T12-L1 there is a circumferential disc without significant spinal canal or left neuroforaminal stenosis. There is mild right neuroforaminal stenosis.
At L1-2 there is a circumferential disc with mild facet hypertrophy causing borderline spinal canal narrowing. There is mild right neuroforaminal stenosis.
At L2-3 there is a left paracentral disc osteophyte complex with mild facet hypertrophy without significant spinal canal or neuroforaminal stenosis.
At L3-4 there is a posterior disc osteophyte complex with mild to moderate facet hypertrophy causing borderline spinal canal narrowing. There is moderate right and mild left neural foraminal stenosis.
At L4-5 there is moderately severe facet hypertrophy and redundancy of the ligamentum flavum with a circumferential disc causing moderate spinal canal stenosis. There is mild bilateral neural foraminal stenosis.
At L5-S1 there is a circumferential disc with moderately severe facet hypertrophy without significant spinal canal stenosis. There is moderately severe right and mild left neural foraminal stenosis.
IMPRESSION: 1. Moderate spinal canal stenosis at L4-5.
2. Moderately severe right neural foraminal stenosis at L5-S1.
